# Patient Record
Sex: Male | Born: 2000 | Race: White | Hispanic: No | Marital: Single | Smoking: Never smoker
Health system: Southern US, Community
[De-identification: ages and names within clinical notes are randomized; demographics above are authoritative.]

## PROBLEM LIST (undated history)

## (undated) HISTORY — PX: NO PAST SURGERIES: SHX2092

## (undated) HISTORY — PX: COLONOSCOPY: SHX174

---
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
## 2020-04-19 DIAGNOSIS — G8929 Other chronic pain: Secondary | ICD-10-CM | POA: Insufficient documentation

## 2021-09-18 ENCOUNTER — Encounter: Payer: Self-pay | Admitting: Pediatric Gastroenterology

## 2021-09-19 ENCOUNTER — Encounter: Payer: Self-pay | Admitting: Gastroenterology

## 2022-01-29 ENCOUNTER — Ambulatory Visit (INDEPENDENT_AMBULATORY_CARE_PROVIDER_SITE_OTHER): Payer: Self-pay | Admitting: Gastroenterology

## 2022-01-29 ENCOUNTER — Encounter: Payer: Self-pay | Admitting: Gastroenterology

## 2022-01-29 VITALS — BP 121/63 | HR 60 | Temp 98.0°F | Ht 75.0 in | Wt 178.5 lb

## 2022-01-29 DIAGNOSIS — K5010 Crohn's disease of large intestine without complications: Secondary | ICD-10-CM

## 2022-01-29 MED ORDER — MESALAMINE 1.2 G PO TBEC: 1.2000 g | DELAYED_RELEASE_TABLET | Freq: Two times a day (BID) | ORAL | 3 refills | Status: DC

## 2022-01-29 NOTE — Progress Notes (Signed)
Cephas Darby, MD [address]  [place]  [place], [place] [address]  Main: [phone]  Fax: [phone]    Gastroenterology Consultation  Referring Provider:     Jerrol Banana.,* Primary Care Physician:  Jerrol Banana., MD Primary Gastroenterologist:  Dr. Kathie Dike, [facility] Reason for Consultation: Crohn's colitis        HPI:   Curtis Moran is a 21 y.o. male referred by Dr. Rosanna Randy, Retia Passe., MD  for consultation & management of Crohn's colitis.  Patient is diagnosed with Crohn's colitis in 2016 after he had an upper endoscopy and colonoscopy on 08/19/2014 which showed gastritis, colitis and proctitis.  TI was not examined.  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  MR enterography of the small bowel was unremarkable.  Since then, he is maintained on Lialda.  He denies any extraintestinal manifestations.  He is currently on Lialda 1.2 g twice daily.  He ran out of medication about a month ago.  He is switching from pediatric GI to adult gastroenterology.  He noticed softer stools than normal with some cramps since stopping Lialda.  His weight has been stable.  He does not smoke or drink alcohol  He attends college in [place], plays college baseball  NSAIDs: None  Antiplts/Anticoagulants/Anti thrombotics: None He denies any family history of IBD, GI malignancy  GI Procedures:  EGD and colonoscopy 09/20/2020 Upper Endoscopy Impressions: - Normal esophagus. Biopsied. - Erythematous mucosa in the stomach. Biopsied. - Normal duodenal bulb, second portion of the duodenum and third portion of the duodenum. Biopsied.   Colonoscopy Impressions: - Erythematous mucosa in the rectum and in the sigmoid colon. Biopsied. - The descending colon, transverse colon, ascending colon and cecum are normal. Biopsied. - The examined portion of the ileum was normal. Biopsied  A.  Duodenum, endoscopic  biopsy: Duodenal mucosa with preserved villous architecture. No increase in intraepithelial lymphocytes..     B.  Stomach, endoscopic biopsy: Gastric antral and oxyntic mucosa with chronic gastritis. To assess the potential etiology of the gastritis, immunohistochemistry for H. pylori was performed (block B1) and the result is negative.     C.  Esophagus, endoscopic biopsy: Esophageal squamous mucosa with no specific pathologic diagnosis. No increase in intraepithelial eosinophils is seen.    D.  Ileum, terminal, endoscopic biopsy: Ileal mucosa with a single non-necrotizing granuloma.    E.  Colon, cecum, ascending, endoscopic biopsy: Colonic mucosa with minimal crypt disarray.    F.  Colon, transverse, descending, endoscopic biopsy: Colonic mucosa with no specific pathologic diagnosis.    G.  Colon, sigmoid, rectum, endoscopic biopsy: Colorectal mucosa with focal reactive lymphoid aggregates.  EGD and colonoscopy 2016 A. Duodenum, second part, endoscopic biopsy:  Duodenal mucosa with no pathologic diagnosis. No acute or chronic duodenitis is identified. No granulomas are identified.   B. Stomach, antrum, endoscopic biopsy:  Gastric antral mucosa with moderate chronic focally active gastritis. Immunohistochemistry for Helicobacter pylori is negative. No granulomas are identified.   C. Esophagus, lower third, endoscopic biopsy:  Squamous mucosa with no pathologic diagnosis. No granulomas are identified.   D. Esophagus, upper third, endoscopic biopsy:  Squamous mucosa with no pathologic diagnosis. No granulomas are identified.   E. Ascending colon, right large intestine, endoscopic biopsy:  Colonic mucosa with a few scattered small non-caseating granulomas and erosions. See comment.   F. Transverse colon, large intestine, endoscopic biopsy:  Colonic mucosa with pathologic diagnosis. No chronic or active colitis is  identified. No granulomas are identified.   G.  Descending colon, left large intestine, endoscopic biopsy:  Colonic mucosa with features of focal active colitis and rare small non-caseating granulomas. See comment.   H. Sigmoid colon, large intestine, endoscopic biopsy:  Colonic mucosa with features of focal active colitis. See comment.   I. Rectum, endoscopic biopsy:  Rectal mucosa with focal active proctitis. See comment. No chronic or active colitis is identified. No granulomas are identified.  History reviewed. No pertinent past medical history.  Past Surgical History:  Procedure Laterality Date   NO PAST SURGERIES      Current Outpatient Medications:    cetirizine (ZYRTEC) 10 MG tablet, Take 10 mg by mouth daily., Disp: , Rfl:    mesalamine (LIALDA) 1.2 g EC tablet, Take 1 tablet (1.2 g total) by mouth 2 (two) times daily., Disp: 180 tablet, Rfl: 3   mometasone (NASONEX) 50 MCG/ACT nasal spray, Place 2 sprays into the nose daily., Disp: , Rfl:    Multiple Vitamin (MULTIVITAMIN) tablet, Take 1 tablet by mouth daily., Disp: , Rfl:    Olopatadine HCl (PATADAY) 0.2 % SOLN, once as needed. , Disp: , Rfl:    Family History  Problem Relation Age of Onset   Allergies Mother    Hypertension Mother    Mitral valve prolapse Mother    Stroke Mother 66   Allergies Sister    Mitral valve prolapse Maternal Grandmother    Parkinson's disease Maternal Grandmother    Cancer Maternal Grandfather        lung   Hypertension Maternal Grandfather    Cancer Other        fam history of colon cancer     Social History   Tobacco Use   Smoking status: Never   Smokeless tobacco: Never  Substance Use Topics   Alcohol use: Yes    Comment: social   Drug use: No    Allergies as of 01/29/2022 - Review Complete 01/29/2022  Allergen Reaction Noted   Peanut oil Shortness Of Breath 03/17/2015   Other  03/16/2015    Review of Systems:    All systems reviewed and negative except where noted in HPI.   Physical Exam:  BP 121/63 (BP  Location: Right Arm, Patient Position: Sitting, Cuff Size: Normal)   Pulse 60   Temp 98 F (36.7 C) (Oral)   Ht 6' 3"  (1.905 m)   Wt 178 lb 8 oz (81 kg)   BMI 22.31 kg/m  No LMP for male patient.  General:   Alert,  Well-developed, well-nourished, pleasant and cooperative in NAD Head:  Normocephalic and atraumatic. Eyes:  Sclera clear, no icterus.   Conjunctiva pink. Ears:  Normal auditory acuity. Nose:  No deformity, discharge, or lesions. Mouth:  No deformity or lesions,oropharynx pink & moist. Neck:  Supple; no masses or thyromegaly. Lungs:  Respirations even and unlabored.  Clear throughout to auscultation.   No wheezes, crackles, or rhonchi. No acute distress. Heart:  Regular rate and rhythm; no murmurs, clicks, rubs, or gallops. Abdomen:  Normal bowel sounds. Soft, non-tender and non-distended without masses, hepatosplenomegaly or hernias noted.  No guarding or rebound tenderness.   Rectal: Not performed Msk:  Symmetrical without gross deformities. Good, equal movement & strength bilaterally. Pulses:  Normal pulses noted. Extremities:  No clubbing or edema.  No cyanosis. Neurologic:  Alert and oriented x3;  grossly normal neurologically. Skin:  Intact without significant lesions or rashes. No jaundice. Psych:  Alert and cooperative. Normal mood and  affect.  Imaging Studies: Reviewed  Assessment and Plan:   Curtis Moran is a 21 y.o. pleasant Caucasian male with history of chronic mild active Crohn's colitis diagnosed in 2016 is maintained on Lialda 2.4 g daily No evidence of anemia.  Currently in clinical and histologic remission Continue Lialda 2.4 g daily  Follow up annually, contact via MyChart as needed   Cephas Darby, MD

## 2022-02-20 IMAGING — MR MR SHOULDER*R* W/CM
6 series · 40 of 40 positions shown · IV contrast (agent unspecified)
Comparison: None.
COMPARISON: None.

Addendum:
CLINICAL DATA: Right shoulder pain after playing baseball, pain
began [DATE]

EXAM:
MR ARTHROGRAM OF THE right SHOULDER
TECHNIQUE: Multiplanar, multisequence MR imaging of the right shoulder was
performed following the administration of intra-articular contrast.
CONTRAST:  See Injection Documentation.

[Series 5: T1 fat-sat · axial · right · 4.0mm · 0.55mm/px · z∈[-30,+90]mm · 6 of 25 slices shown (1 of 3)]
[im 1/25]
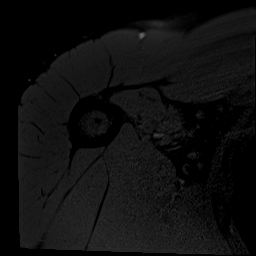
[im 5/25]
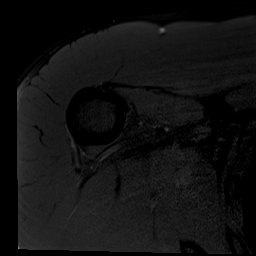
[im 10/25]
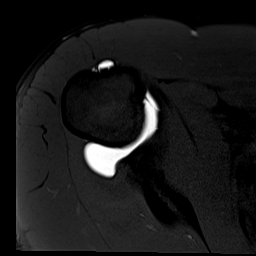
[im 15/25]
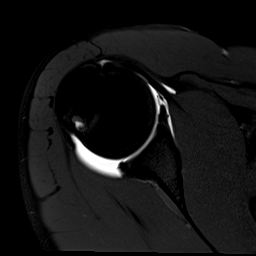
[im 20/25]
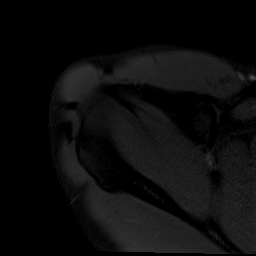
[im 25/25]
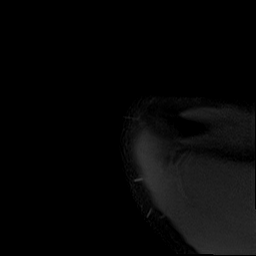

[Series 6: T1 fat-sat · oblique · right · 4.0mm · 0.55mm/px · 6 of 25 slices shown (2 of 3)]
[im 1/25]
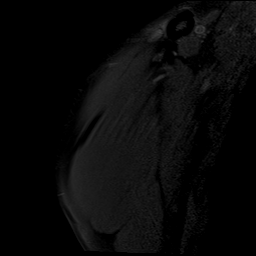
[im 5/25]
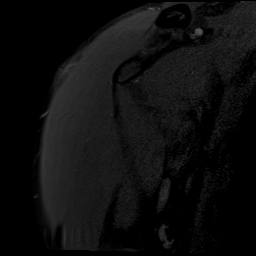
[im 10/25]
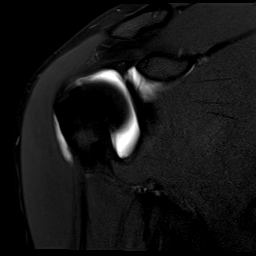
[im 15/25]
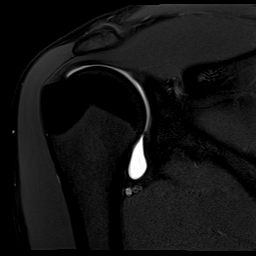
[im 20/25]
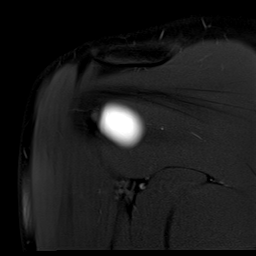
[im 25/25]
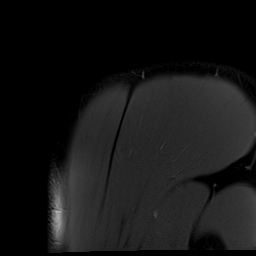

[Series 7: T2 fat-sat · oblique · right · 4.0mm · 0.55mm/px · 7 of 26 slices shown (1 of 2)]
[im 1/26]
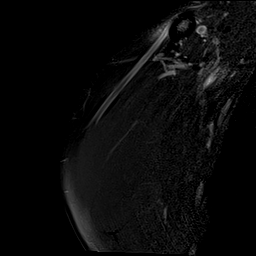
[im 5/26]
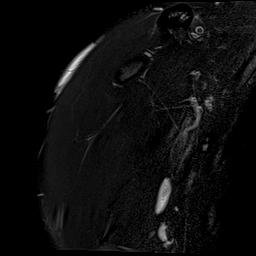
[im 9/26]
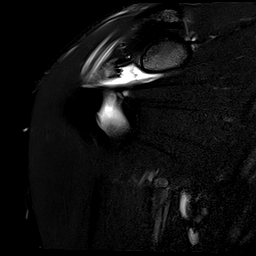
[im 13/26]
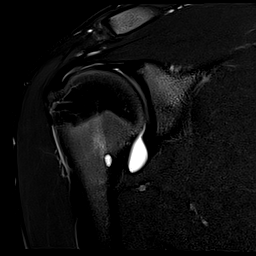
[im 17/26]
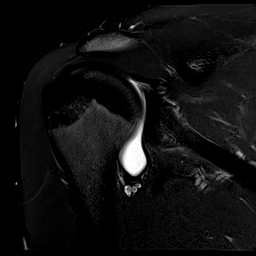
[im 21/26]
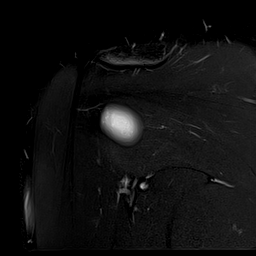
[im 26/26]
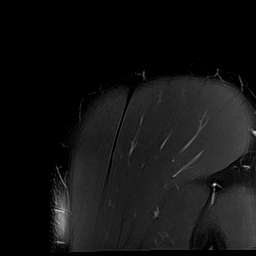

[Series 9: T2 fat-sat · coronal · right · 4.0mm · 0.55mm/px · 7 of 25 slices shown (2 of 2)]
[im 1/25]
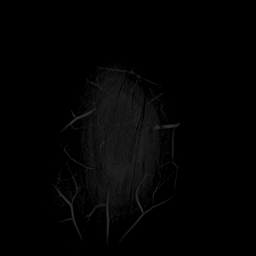
[im 5/25]
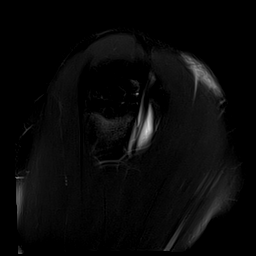
[im 9/25]
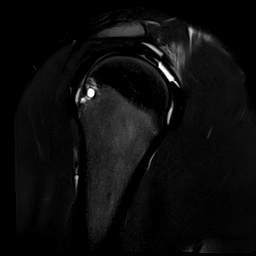
[im 13/25]
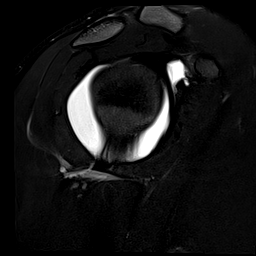
[im 17/25]
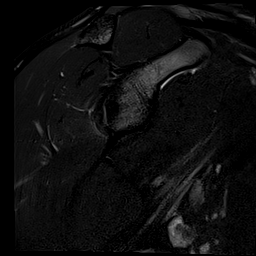
[im 21/25]
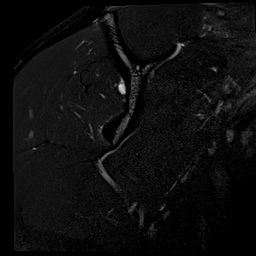
[im 25/25]
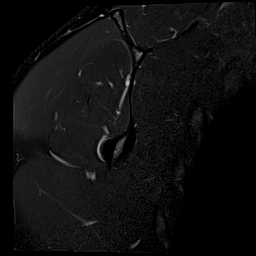

[Series 10: T1 · oblique · right · 4.0mm · 0.51mm/px · 7 of 26 slices shown]
[im 1/26]
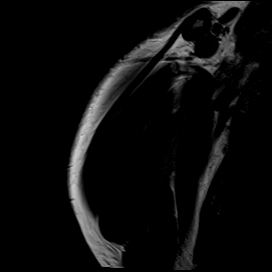
[im 5/26]
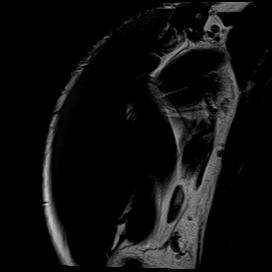
[im 9/26]
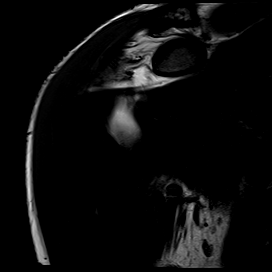
[im 13/26]
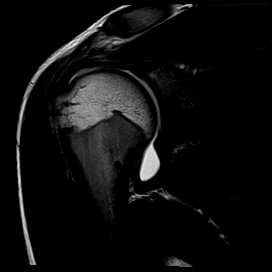
[im 17/26]
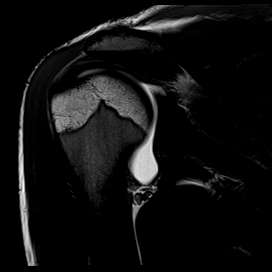
[im 21/26]
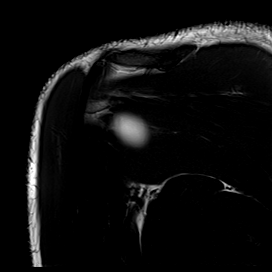
[im 26/26]
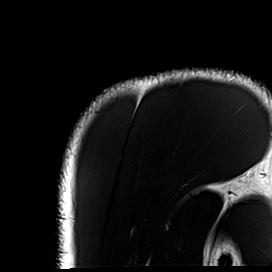

[Series 14: T1 fat-sat · sagittal · right · 4.0mm · 0.62mm/px · 7 of 26 slices shown (3 of 3)]
[im 1/26]
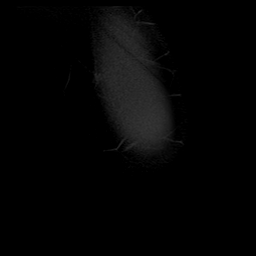
[im 5/26]
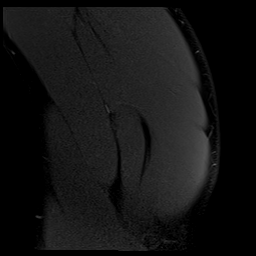
[im 9/26]
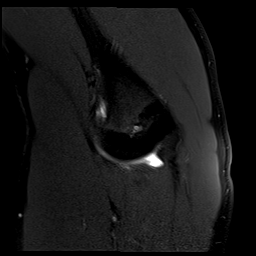
[im 13/26]
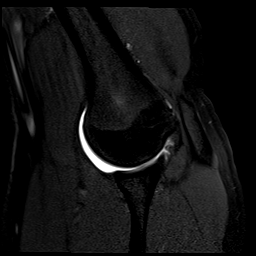
[im 17/26]
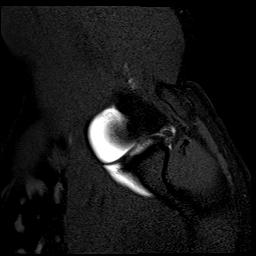
[im 21/26]
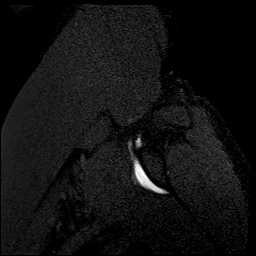
[im 26/26]
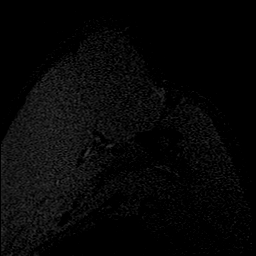

[40 of 40 positions shown; findings below may reference images not displayed]

FINDINGS: Rotator cuff: Mildly increased signal seen at the insertion site of
the infraspinatus tendon at the insertion site. The supraspinatus,
subscapularis, and teres minor tendons are intact. The muscles of
the rotator cuff are normal without tear, edema, or atrophy.

Muscles: The muscles other than the rotator cuff are normal without
tear, edema, or atrophy.

Biceps Long Head: The Intraarticular and extraarticular portions of
the biceps tendon are normal in position, size and signal.

Acromioclavicular Joint: The acromioclavicular joint is intact. Type
II acromion.

Glenohumeral Joint: The glenohumeral joint alignment is well
maintained. The humeral head and glenoid articular cartilage are
without focal defect or significant thinning. No joint effusion.

Labrum: The glenoid labrum is grossly intact without evidence large
tear or detachment. However the evaluation is limited by lack of
intraarticular fluid.

Bones: No fracture, osteonecrosis, or pathologic marrow
infiltration. There is slight contour abnormality of the
posterolateral corner of the humeral head with small cystic changes.

Other: The subacromial-subdeltoid bursa is normal without evidence
of bursitis.
IMPRESSION: Mild insertional infraspinatus tendinosis. No full-thickness rotator
cuff tears.

Slight contour abnormality of the posterolateral corner of the
humeral head with small cystic changes which could be from a prior
injury.

ADDENDUM:
Under the labrum section above it should state the labrum is intact
without evidence of tear or detachment. Adequate distension is seen
with intra-articular contrast.

*** End of Addendum ***
FINDINGS: Rotator cuff: Mildly increased signal seen at the insertion site of
the infraspinatus tendon at the insertion site. The supraspinatus,
subscapularis, and teres minor tendons are intact. The muscles of
the rotator cuff are normal without tear, edema, or atrophy.

Muscles: The muscles other than the rotator cuff are normal without
tear, edema, or atrophy.

Biceps Long Head: The Intraarticular and extraarticular portions of
the biceps tendon are normal in position, size and signal.

Acromioclavicular Joint: The acromioclavicular joint is intact. Type
II acromion.

Glenohumeral Joint: The glenohumeral joint alignment is well
maintained. The humeral head and glenoid articular cartilage are
without focal defect or significant thinning. No joint effusion.

Labrum: The glenoid labrum is grossly intact without evidence large
tear or detachment. However the evaluation is limited by lack of
intraarticular fluid.

Bones: No fracture, osteonecrosis, or pathologic marrow
infiltration. There is slight contour abnormality of the
posterolateral corner of the humeral head with small cystic changes.

Other: The subacromial-subdeltoid bursa is normal without evidence
of bursitis.
IMPRESSION: Mild insertional infraspinatus tendinosis. No full-thickness rotator
cuff tears.

Slight contour abnormality of the posterolateral corner of the
humeral head with small cystic changes which could be from a prior
injury.

## 2022-02-20 IMAGING — RF DG ARTHROGRAM SHOULDER*R*
3 series · 3 of 3 positions shown · non-contrast
Comparison: None.

MEDICATIONS:
None.

FLUOROSCOPY TIME:  48 seconds

COMPLICATIONS:
None immediate

INDICATION: Right shoulder pain

EXAM:
ARTHROGRAM OF THE RIGHT SHOULDER
TECHNIQUE: Informed written consent was obtained from the patient after a
discussion of the risks, benefits and alternatives to treatment. The
patient was placed supine on the fluoroscopy table and the right
upper extremity was placed in a slight degree of external rotation.
The anterior aspect of the shoulder was prepped and draped in the
usual sterile fashion. A timeout was performed prior to the
initiation of the procedure.

[Series 1: fluoro_arthrogram_singleshot_bw · 0.17mm/px · 1 of 1 slices shown (1 of 3)]
[im 1/1]
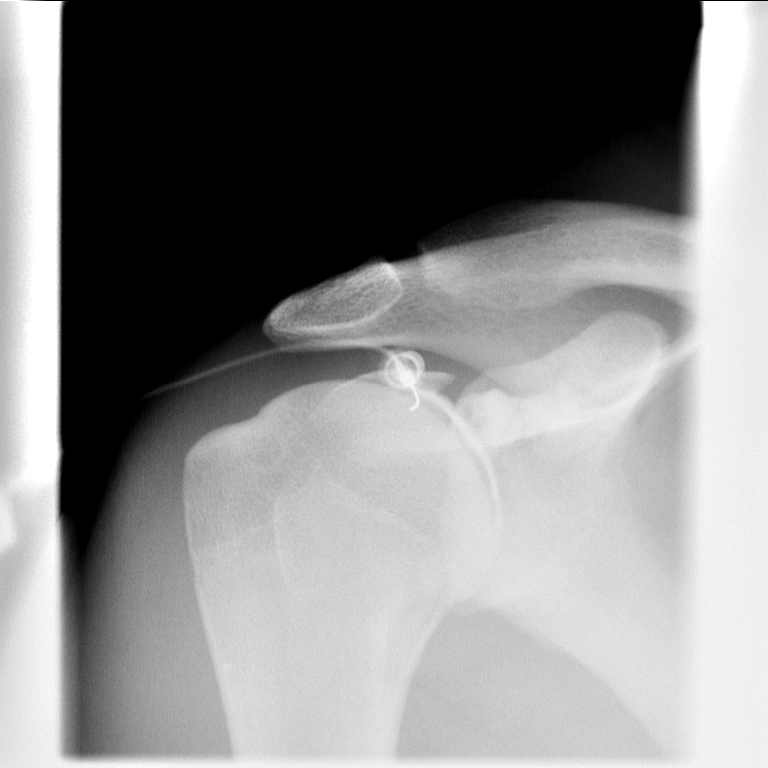

[Series 2: fluoro_arthrogram_singleshot_bw · 0.17mm/px · 1 of 1 slices shown (2 of 3)]
[im 1/1]
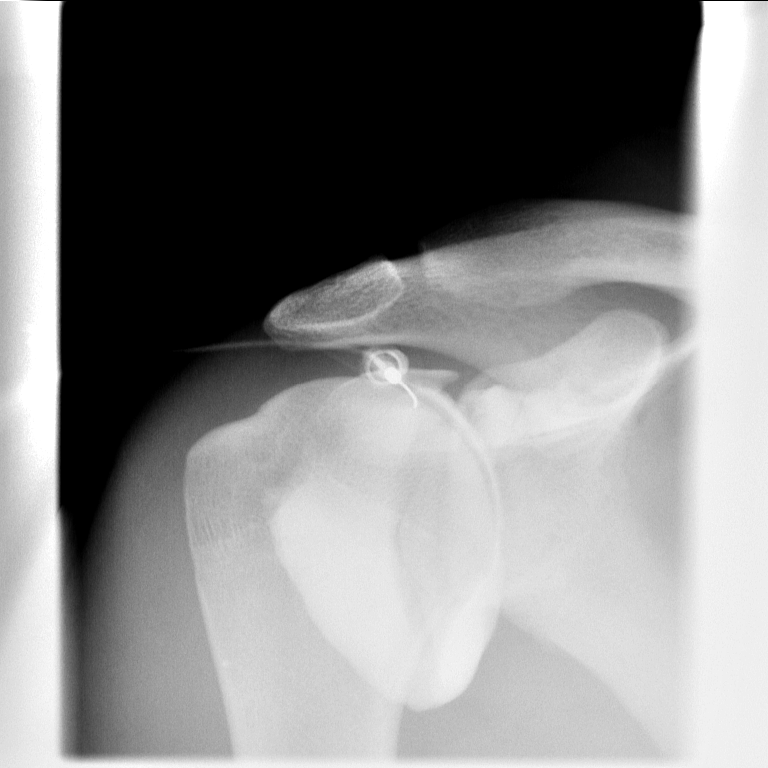

[Series 3: fluoro_arthrogram_singleshot_bw · 0.17mm/px · 1 of 1 slices shown (3 of 3)]
[im 1/1]
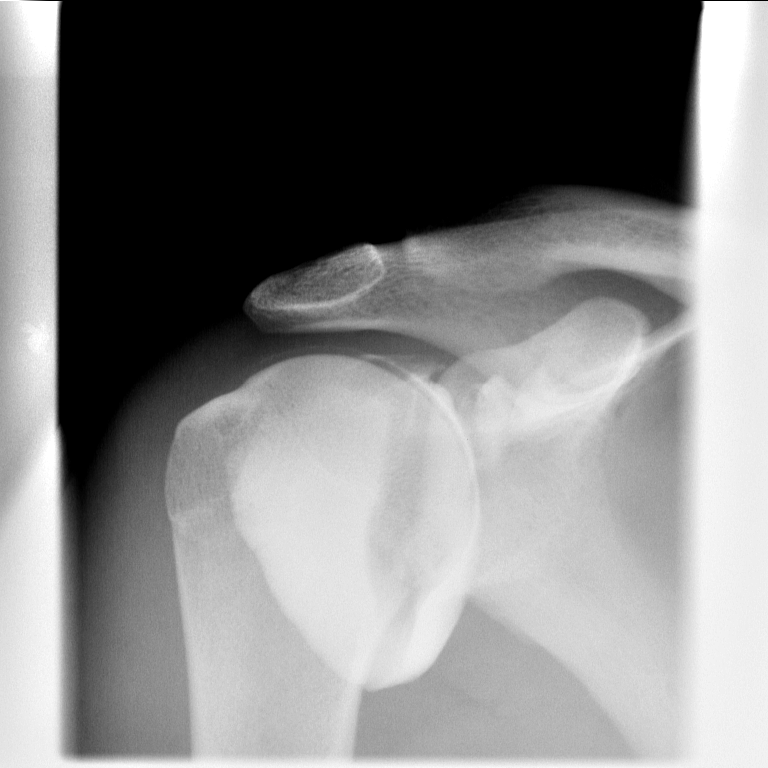

[3 of 3 positions shown; findings below may reference images not displayed]

The inferior medial aspect of the humeral head was marked
fluoroscopically and the overlying soft tissues were anesthetized
with 1% lidocaine. Under intermittent fluoroscopic guidance, a 22
gauge spinal needle was advanced into the glenohumeral joint space.
Appropriate positioning was confirmed with the injection of a small
amount of contrast. At this point a total of 12 mL of a mixture of
10 mL of Omnipaque 180, 10 mL normal saline and 0.05 mL of Gadavist
was injected into the joint space. A fluoroscopic image was saved
and sent to PACS. At this point the procedure was terminated. The
needle was withdrawn and a dressing was placed. The patient
tolerated the procedure well without immediate post procedural
complication. The patient was escorted to MRI.
IMPRESSION: Successful fluoroscopic guided arthrogram of the right shoulder.

## 2023-05-06 ENCOUNTER — Other Ambulatory Visit: Payer: Self-pay | Admitting: Gastroenterology

## 2023-05-26 ENCOUNTER — Telehealth: Payer: Self-pay | Admitting: Gastroenterology

## 2023-05-26 MED ORDER — MESALAMINE 1.2 G PO TBEC: 1.2000 g | DELAYED_RELEASE_TABLET | Freq: Two times a day (BID) | ORAL | 0 refills | Status: DC

## 2023-05-26 NOTE — Telephone Encounter (Signed)
 Last office visit 01/29/2022 crohn's Last refill  01/29/2022 3 refills

## 2023-05-26 NOTE — Addendum Note (Signed)
 Addended by: Conny Del L on: 05/26/2023 03:08 PM   Modules accepted: Orders

## 2023-05-26 NOTE — Telephone Encounter (Signed)
 The patient called requesting to schedule an appointment with Dr. Baldomero Bone for a medication refill. The medication is Lialda 1.2g EC.  The patient's preferred pharmacy is: [facility] [address] [place], [place] [address] Phone: [phone]

## 2023-06-18 ENCOUNTER — Ambulatory Visit: Payer: Self-pay | Admitting: Gastroenterology

## 2023-06-18 ENCOUNTER — Encounter: Payer: Self-pay | Admitting: Gastroenterology

## 2023-06-18 VITALS — BP 119/71 | HR 75 | Temp 97.8°F | Ht 75.0 in | Wt 214.2 lb

## 2023-06-18 DIAGNOSIS — K5010 Crohn's disease of large intestine without complications: Secondary | ICD-10-CM | POA: Diagnosis not present

## 2023-06-18 DIAGNOSIS — Z91010 Allergy to peanuts: Secondary | ICD-10-CM | POA: Insufficient documentation

## 2023-06-18 MED ORDER — MESALAMINE 1.2 G PO TBEC: 1.2000 g | DELAYED_RELEASE_TABLET | Freq: Two times a day (BID) | ORAL | 3 refills | Status: DC

## 2023-06-18 NOTE — Progress Notes (Signed)
 Karma Oz, MD [address]  [address]  [place], [place] [address]  Main: [phone]  Fax: [phone]    Gastroenterology Consultation  Referring Provider:     Nikki Barters, MD Primary Care Physician:  Nikki Barters, MD Primary Gastroenterologist:  Dr. Charlet Conradi, [facility] Reason for Consultation: Crohn's colitis        HPI:   Curtis Moran is a 23 y.o. male referred by Dr. Nikki Barters, MD  for consultation & management of Crohn's colitis.  Patient is diagnosed with Crohn's colitis in 2016 after he had an upper endoscopy and colonoscopy on 08/19/2014 which showed gastritis, colitis and proctitis.  TI was not examined.  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  MR enterography of the small bowel was unremarkable.  Since then, he is maintained on Lialda .  He denies any extraintestinal manifestations.  He is currently on Lialda  1.2 g twice daily.  He ran out of medication about a month ago.  He is switching from pediatric GI to adult gastroenterology.  He noticed softer stools than normal with some cramps since stopping Lialda .  His weight has been stable.  Follow-up visit 06/18/2023 Curtis Moran is here for refills on Lialda .  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  He denies any symptoms related to Crohn's disease.  He has gained about 15 pounds within last 1 year or so.  He tries to stay active.  He does not smoke or drink alcohol  NSAIDs: None  Antiplts/Anticoagulants/Anti thrombotics: None He denies any family history of IBD, GI malignancy  GI Procedures:  EGD and colonoscopy 09/20/2020 Upper Endoscopy Impressions: - Normal esophagus. Biopsied. - Erythematous mucosa in the stomach. Biopsied. - Normal duodenal bulb, second portion of the duodenum and third portion of the duodenum. Biopsied.   Colonoscopy  Impressions: - Erythematous mucosa in the rectum and in the  sigmoid colon. Biopsied. - The descending colon, transverse colon, ascending colon and cecum are normal. Biopsied. - The examined portion of the ileum was normal. Biopsied  A.  Duodenum, endoscopic biopsy: Duodenal mucosa with preserved villous architecture. No increase in intraepithelial lymphocytes..     B.  Stomach, endoscopic biopsy: Gastric antral and oxyntic mucosa with chronic gastritis. To assess the potential etiology of the gastritis, immunohistochemistry for H. pylori was performed (block B1) and the result is negative.     C.  Esophagus, endoscopic biopsy: Esophageal squamous mucosa with no specific pathologic diagnosis. No increase in intraepithelial eosinophils is seen.    D.  Ileum, terminal, endoscopic biopsy: Ileal mucosa with a single non-necrotizing granuloma.    E.  Colon, cecum, ascending, endoscopic biopsy: Colonic mucosa with minimal crypt disarray.    F.  Colon, transverse, descending, endoscopic biopsy: Colonic mucosa with no specific pathologic diagnosis.    G.  Colon, sigmoid, rectum, endoscopic biopsy: Colorectal mucosa with focal reactive lymphoid aggregates.  EGD and colonoscopy 2016 A. Duodenum, second part, endoscopic biopsy:  Duodenal mucosa with no pathologic diagnosis. No acute or chronic duodenitis is identified. No granulomas are identified.   B. Stomach, antrum, endoscopic biopsy:  Gastric antral mucosa with moderate chronic focally active gastritis. Immunohistochemistry for Helicobacter pylori is negative. No granulomas are identified.   C. Esophagus, lower third, endoscopic biopsy:  Squamous mucosa with no pathologic diagnosis. No granulomas are identified.   D. Esophagus, upper third, endoscopic biopsy:  Squamous mucosa with no pathologic diagnosis. No granulomas are identified.   E.  Ascending colon, right large intestine, endoscopic biopsy:  Colonic mucosa with a few scattered small non-caseating granulomas and erosions. See  comment.   F. Transverse colon, large intestine, endoscopic biopsy:  Colonic mucosa with pathologic diagnosis. No chronic or active colitis is identified. No granulomas are identified.   G. Descending colon, left large intestine, endoscopic biopsy:  Colonic mucosa with features of focal active colitis and rare small non-caseating granulomas. See comment.   H. Sigmoid colon, large intestine, endoscopic biopsy:  Colonic mucosa with features of focal active colitis. See comment.   I. Rectum, endoscopic biopsy:  Rectal mucosa with focal active proctitis. See comment. No chronic or active colitis is identified. No granulomas are identified.  History reviewed. No pertinent past medical history.  Past Surgical History:  Procedure Laterality Date   NO PAST SURGERIES      Current Outpatient Medications:    cetirizine (ZYRTEC) 10 MG tablet, Take 10 mg by mouth daily., Disp: , Rfl:    mometasone (NASONEX) 50 MCG/ACT nasal spray, Place 2 sprays into the nose daily., Disp: , Rfl:    Multiple Vitamin (MULTIVITAMIN) tablet, Take 1 tablet by mouth daily., Disp: , Rfl:    Olopatadine HCl (PATADAY) 0.2 % SOLN, once as needed. , Disp: , Rfl:    mesalamine  (LIALDA ) 1.2 g EC tablet, Take 1 tablet (1.2 g total) by mouth 2 (two) times daily., Disp: 180 tablet, Rfl: 3   Family History  Problem Relation Age of Onset   Allergies Mother    Hypertension Mother    Mitral valve prolapse Mother    Stroke Mother 7   Allergies Sister    Mitral valve prolapse Maternal Grandmother    Parkinson's disease Maternal Grandmother    Cancer Maternal Grandfather        lung   Hypertension Maternal Grandfather    Cancer Other        fam history of colon cancer     Social History   Tobacco Use   Smoking status: Never   Smokeless tobacco: Never  Substance Use Topics   Alcohol use: Yes    Comment: social   Drug use: No    Allergies as of 06/18/2023 - Review Complete 06/18/2023  Allergen Reaction Noted    Peanut oil Shortness Of Breath 03/17/2015   Other  03/16/2015    Review of Systems:    All systems reviewed and negative except where noted in HPI.   Physical Exam:  BP 119/71 (BP Location: Left Arm, Patient Position: Sitting, Cuff Size: Normal)   Pulse 75   Temp 97.8 F (36.6 C) (Oral)   Ht 6\' 3"  (1.905 m)   Wt 214 lb 4 oz (97.2 kg)   BMI 26.78 kg/m  No LMP for male patient.  General:   Alert,  Well-developed, well-nourished, pleasant and cooperative in NAD Head:  Normocephalic and atraumatic. Eyes:  Sclera clear, no icterus.   Conjunctiva pink. Ears:  Normal auditory acuity. Nose:  No deformity, discharge, or lesions. Mouth:  No deformity or lesions,oropharynx pink & moist. Neck:  Supple; no masses or thyromegaly. Lungs:  Respirations even and unlabored.  Clear throughout to auscultation.   No wheezes, crackles, or rhonchi. No acute distress. Heart:  Regular rate and rhythm; no murmurs, clicks, rubs, or gallops. Abdomen:  Normal bowel sounds. Soft, non-tender and non-distended without masses, hepatosplenomegaly or hernias noted.  No guarding or rebound tenderness.   Rectal: Not performed Msk:  Symmetrical without gross deformities. Good, equal movement & strength bilaterally.  Pulses:  Normal pulses noted. Extremities:  No clubbing or edema.  No cyanosis. Neurologic:  Alert and oriented x3;  grossly normal neurologically. Skin:  Intact without significant lesions or rashes. No jaundice. Psych:  Alert and cooperative. Normal mood and affect.  Imaging Studies: Reviewed  Assessment and Plan:   Angie Favret is a 23 y.o. pleasant Caucasian male with history of chronic mild active Crohn's colitis diagnosed in 2016 is maintained on Lialda  2.4 g daily No evidence of anemia.  Currently in clinical and histologic remission Continue Lialda  2.4 g daily, refill medication  Follow up annually, contact via MyChart as needed   Karma Oz, MD

## 2023-08-18 ENCOUNTER — Encounter: Payer: Self-pay | Admitting: Gastroenterology

## 2023-09-10 ENCOUNTER — Other Ambulatory Visit: Payer: Self-pay

## 2023-09-10 ENCOUNTER — Ambulatory Visit: Admitting: Certified Registered"

## 2023-09-10 ENCOUNTER — Ambulatory Visit
Admission: RE | Admit: 2023-09-10 | Discharge: 2023-09-10 | Disposition: A | Discharge status: Home / Self Care | Attending: Gastroenterology | Admitting: Gastroenterology

## 2023-09-10 ENCOUNTER — Encounter: Payer: Self-pay | Admitting: Gastroenterology

## 2023-09-10 ENCOUNTER — Encounter
Admission: RE | Disposition: A | Discharge status: Home / Self Care | Payer: Self-pay | Source: Home / Self Care | Attending: Gastroenterology

## 2023-09-10 DIAGNOSIS — K5080 Crohn's disease of both small and large intestine without complications: Secondary | ICD-10-CM | POA: Diagnosis present

## 2023-09-10 DIAGNOSIS — D123 Benign neoplasm of transverse colon: Secondary | ICD-10-CM | POA: Diagnosis not present

## 2023-09-10 DIAGNOSIS — K644 Residual hemorrhoidal skin tags: Secondary | ICD-10-CM | POA: Diagnosis not present

## 2023-09-10 HISTORY — PX: POLYPECTOMY: SHX149

## 2023-09-10 HISTORY — PX: COLONOSCOPY: SHX5424

## 2023-09-10 SURGERY — COLONOSCOPY
Anesthesia: General

## 2023-09-10 MED ORDER — GLYCOPYRROLATE 0.2 MG/ML IJ SOLN
INTRAMUSCULAR | Status: DC | PRN
  Administered 2023-09-10: .2 mg via INTRAVENOUS

## 2023-09-10 MED ORDER — SODIUM CHLORIDE 0.9 % IV SOLN
INTRAVENOUS | Status: DC
  Administered 2023-09-10: 500 mL via INTRAVENOUS

## 2023-09-10 MED ORDER — BUDESONIDE 3 MG PO CPEP
9.0000 mg | ORAL_CAPSULE | Freq: Every day | ORAL | 1 refills | Status: AC
Start: 2023-09-10 — End: 2023-11-09

## 2023-09-10 MED ORDER — MIDAZOLAM HCL 2 MG/2ML IJ SOLN
INTRAMUSCULAR | Status: DC | PRN
  Administered 2023-09-10: 2 mg via INTRAVENOUS

## 2023-09-10 MED ORDER — PROPOFOL 500 MG/50ML IV EMUL
INTRAVENOUS | Status: DC | PRN
  Administered 2023-09-10: 165 ug/kg/min via INTRAVENOUS

## 2023-09-10 MED ORDER — MIDAZOLAM HCL 2 MG/2ML IJ SOLN
INTRAMUSCULAR | Status: AC
  Filled 2023-09-10: qty 2

## 2023-09-10 MED ORDER — LIDOCAINE HCL (CARDIAC) PF 100 MG/5ML IV SOSY
PREFILLED_SYRINGE | INTRAVENOUS | Status: DC | PRN
  Administered 2023-09-10: 100 mg via INTRAVENOUS

## 2023-09-10 MED ORDER — DEXMEDETOMIDINE HCL IN NACL 200 MCG/50ML IV SOLN
INTRAVENOUS | Status: DC | PRN
  Administered 2023-09-10: 12 ug via INTRAVENOUS

## 2023-09-10 MED ORDER — PROPOFOL 10 MG/ML IV BOLUS
INTRAVENOUS | Status: DC | PRN
  Administered 2023-09-10: 40 mg via INTRAVENOUS
  Administered 2023-09-10: 60 mg via INTRAVENOUS
  Administered 2023-09-10: 40 mg via INTRAVENOUS

## 2023-09-10 NOTE — H&P (Signed)
 Curtis JONELLE Brooklyn, MD [facility], [facility] [address]  [place], [place] [address]  Main: [phone] Fax:  [phone] Pager: [phone]   Primary Care Physician:  Bertrum Charlie CROME, MD Primary Gastroenterologist:  Dr. Corinn JONELLE Moran  Pre-Procedure History & Physical: HPI:  Curtis Moran is a 23 y.o. male is here for an colonoscopy.   History reviewed. No pertinent past medical history.  Past Surgical History:  Procedure Laterality Date   COLONOSCOPY     NO PAST SURGERIES      Prior to Admission medications   Medication Sig Start Date End Date Taking? Authorizing Provider  cetirizine (ZYRTEC) 10 MG tablet Take 10 mg by mouth daily.    [provider]  mesalamine  (LIALDA ) 1.2 g EC tablet Take 1 tablet (1.2 g total) by mouth 2 (two) times daily. 06/18/23   Shantal Roan Reddy, MD  mometasone (NASONEX) 50 MCG/ACT nasal spray Place 2 sprays into the nose daily.    [provider]  Multiple Vitamin (MULTIVITAMIN) tablet Take 1 tablet by mouth daily.    [provider]  Olopatadine HCl (PATADAY) 0.2 % SOLN once as needed.     [provider]    Allergies as of 08/29/2023 - Review Complete 06/18/2023  Allergen Reaction Noted   Peanut oil Shortness Of Breath 03/17/2015   Other  03/16/2015    Family History  Problem Relation Age of Onset   Allergies Mother    Hypertension Mother    Mitral valve prolapse Mother    Stroke Mother 6   Allergies Sister    Mitral valve prolapse Maternal Grandmother    Parkinson's disease Maternal Grandmother    Cancer Maternal Grandfather        lung   Hypertension Maternal Grandfather    Cancer Other        fam history of colon cancer    Social History   Socioeconomic History   Marital status: Single    Spouse name: Not on file   Number of children: Not on file   Years of education: Not on file   Highest education level: Not on file  Occupational History   Not on file   Tobacco Use   Smoking status: Never   Smokeless tobacco: Never  Vaping Use   Vaping status: Never Used  Substance and Sexual Activity   Alcohol use: Yes    Comment: every other day - beer   Drug use: No   Sexual activity: Never  Other Topics Concern   Not on file  Social History Narrative   Not on file   Social Drivers of Health   Financial Resource Strain: Low Risk  (08/19/2023)   Received from [facility]   Overall Financial Resource Strain (CARDIA)    Difficulty of Paying Living Expenses: Not hard at all  Food Insecurity: No Food Insecurity (08/19/2023)   Received from [facility]   Hunger Vital Sign    Within the past 12 months, you worried that your food would run out before you got the money to buy more.: Never true    Within the past 12 months, the food you bought just didn't last and you didn't have money to get more.: Never true  Transportation Needs: No Transportation Needs (08/19/2023)   Received from [facility] - Transportation    In the past 12 months, has lack of transportation kept you from medical appointments or from getting  medications?: No    Lack of Transportation (Non-Medical): No  Physical Activity: Not on file  Stress: Not on file  Social Connections: Not on file  Intimate Partner Violence: Not on file    Review of Systems: See HPI, otherwise negative ROS  Physical Exam: BP (!) 120/59   Pulse (!) 51   Temp (!) 96 F (35.6 C) (Tympanic)   Resp 12   Ht 6' 3 (1.905 m)   Wt 94.6 kg   SpO2 99%   BMI 26.07 kg/m  General:   Alert,  pleasant and cooperative in NAD Head:  Normocephalic and atraumatic. Neck:  Supple; no masses or thyromegaly. Lungs:  Clear throughout to auscultation.    Heart:  Regular rate and rhythm. Abdomen:  Soft, nontender and nondistended. Normal bowel sounds, without guarding, and without rebound.   Neurologic:  Alert and  oriented x4;  grossly normal  neurologically.  Impression/Plan: Curtis Moran is here for an colonoscopy to be performed for crohn's colitis  Risks, benefits, limitations, and alternatives regarding  colonoscopy have been reviewed with the patient.  Questions have been answered.  All parties agreeable.   Curtis Brooklyn, MD  09/10/2023, 9:58 AM

## 2023-09-10 NOTE — Anesthesia Preprocedure Evaluation (Signed)
 Anesthesia Evaluation  Patient identified by MRN, date of birth, ID band Patient awake    Reviewed: Allergy & Precautions, NPO status , Patient's Chart, lab work & pertinent test results  Airway Mallampati: III  TM Distance: >3 FB Neck ROM: full    Dental  (+) Chipped   Pulmonary neg pulmonary ROS   Pulmonary exam normal        Cardiovascular negative cardio ROS Normal cardiovascular exam     Neuro/Psych negative neurological ROS     GI/Hepatic negative GI ROS, Neg liver ROS,,,  Endo/Other  negative endocrine ROS    Renal/GU negative Renal ROS  negative genitourinary   Musculoskeletal   Abdominal   Peds  Hematology negative hematology ROS (+)   Anesthesia Other Findings History reviewed. No pertinent past medical history.  Past Surgical History: No date: COLONOSCOPY No date: NO PAST SURGERIES  BMI    Body Mass Index: 26.07 kg/m      Reproductive/Obstetrics negative OB ROS                              Anesthesia Physical Anesthesia Plan  ASA: 1  Anesthesia Plan: General   Post-op Pain Management: Minimal or no pain anticipated   Induction: Intravenous  PONV Risk Score and Plan: 2 and Propofol  infusion and TIVA  Airway Management Planned: Nasal Cannula  Additional Equipment: None  Intra-op Plan:   Post-operative Plan:   Informed Consent: I have reviewed the patients History and Physical, chart, labs and discussed the procedure including the risks, benefits and alternatives for the proposed anesthesia with the patient or authorized representative who has indicated his/her understanding and acceptance.     Dental advisory given  Plan Discussed with: CRNA and Surgeon  Anesthesia Plan Comments: (Discussed risks of anesthesia with patient, including possibility of difficulty with spontaneous ventilation under anesthesia necessitating airway intervention, PONV, and  rare risks such as cardiac or respiratory or neurological events, and allergic reactions. Discussed the role of CRNA in patient's perioperative care. Patient understands.)        Anesthesia Quick Evaluation

## 2023-09-10 NOTE — Anesthesia Procedure Notes (Addendum)
 Procedure Name: General with mask airway Date/Time: 09/10/2023 10:27 AM  Performed by: Ledora Duncan, CRNAPre-anesthesia Checklist: Patient identified, Emergency Drugs available, Suction available, Patient being monitored and Timeout performed Patient Re-evaluated:Patient Re-evaluated prior to induction Oxygen Delivery Method: Simple face mask Induction Type: IV induction Placement Confirmation: positive ETCO2 and breath sounds checked- equal and bilateral Dental Injury: Teeth and Oropharynx as per pre-operative assessment

## 2023-09-10 NOTE — Op Note (Signed)
 [facility] Patient Name: Curtis Moran Procedure Date: 09/10/2023 9:58 AM MRN: [id number] Account #: [id number] Date of Birth: [birth year] Admit Type: Outpatient Age: 23 Room: [facility] ENDO ROOM 4 Gender: Male Note Status: Finalized Instrument Name: Peds Colonoscope [id number] Procedure:             Colonoscopy Indications:           Disease activity assessment of Crohn's disease of the                         colon Providers:             Corinn Jess Brooklyn MD, MD Referring MD:          Charlie CROME. Bertrum, MD (Referring MD) Medicines:             General Anesthesia Complications:         No immediate complications. Estimated blood loss: None. Procedure:             Pre-Anesthesia Assessment:                        - Prior to the procedure, a History and Physical was                         performed, and patient medications and allergies were                         reviewed. The patient is competent. The risks and                         benefits of the procedure and the sedation options and                         risks were discussed with the patient. All questions                         were answered and informed consent was obtained.                         Patient identification and proposed procedure were                         verified by the physician, the nurse, the                         anesthesiologist, the anesthetist and the technician                         in the pre-procedure area in the procedure room in the                         endoscopy suite. Mental Status Examination: alert and                         oriented. Airway Examination: normal oropharyngeal                         airway and neck mobility. Respiratory Examination:  clear to auscultation. CV Examination: normal.                         Prophylactic Antibiotics: The patient does not require                         prophylactic antibiotics.  Prior Anticoagulants: The                         patient has taken no anticoagulant or antiplatelet                         agents. ASA Grade Assessment: I - A normal, healthy                         patient. After reviewing the risks and benefits, the                         patient was deemed in satisfactory condition to                         undergo the procedure. The anesthesia plan was to use                         general anesthesia. Immediately prior to                         administration of medications, the patient was                         re-assessed for adequacy to receive sedatives. The                         heart rate, respiratory rate, oxygen saturations,                         blood pressure, adequacy of pulmonary ventilation, and                         response to care were monitored throughout the                         procedure. The physical status of the patient was                         re-assessed after the procedure.                        After obtaining informed consent, the colonoscope was                         passed under direct vision. Throughout the procedure,                         the patient's blood pressure, pulse, and oxygen                         saturations were monitored continuously. The  Colonoscope was introduced through the anus and                         advanced to the the terminal ileum, with                         identification of the appendiceal orifice and IC                         valve. The colonoscopy was performed without                         difficulty. The patient tolerated the procedure well.                         The quality of the bowel preparation was evaluated                         using the BBPS [facility] Bowel Preparation Scale) with                         scores of: Right Colon = 3, Transverse Colon = 3 and                         Left Colon = 3 (entire mucosa seen well with no                          residual staining, small fragments of stool or opaque                         liquid). The total BBPS score equals 9. The terminal                         ileum, ileocecal valve, appendiceal orifice, and                         rectum were photographed. Findings:      The perianal and digital rectal examinations were normal. Pertinent       negatives include normal sphincter tone and no palpable rectal lesions.      Patchy inflammation characterized by congestion (edema), erosions,       erythema, friability and mucus was found in the terminal ileum. The       inflammation was moderate in severity. Biopsies were taken with a cold       forceps for histology.      Normal mucosa was found in the left colon and in the right colon.       Biopsies were taken with a cold forceps for histology.      A patchy area of mildly erythematous mucosa was found in the distal       rectum. Biopsies were taken with a cold forceps for histology.      Non-bleeding external hemorrhoids were found during retroflexion. The       hemorrhoids were medium-sized.      Two sessile polyps were found in the transverse colon. The polyps were 4       to 5 mm in size. These polyps were removed with a cold snare. Resection  and retrieval were complete. Estimated blood loss: none. Impression:            - Crohn's disease with ileitis. Inflammation was                         found. This was moderate in severity. Biopsied.                        - Normal mucosa in the left colon and in the right                         colon. Biopsied.                        - Erythematous mucosa in the distal rectum. Biopsied.                        - Non-bleeding external hemorrhoids.                        - Two 4 to 5 mm polyps in the transverse colon,                         removed with a cold snare. Resected and retrieved. Recommendation:        - Discharge patient to home (with parent).                         - Resume previous diet today.                        - Continue present medications.                        - Await pathology results.                        - Return to my office as previously scheduled. Procedure Code(s):     --- Professional ---                        [phone], Colonoscopy, flexible; with removal of                         tumor(s), polyp(s), or other lesion(s) by snare                         technique                        45380, 59, Colonoscopy, flexible; with biopsy, single                         or multiple Diagnosis Code(s):     --- Professional ---                        K50.00, Crohn's disease of small intestine without                         complications  K64.4, Residual hemorrhoidal skin tags                        K62.89, Other specified diseases of anus and rectum                        D12.3, Benign neoplasm of transverse colon (hepatic                         flexure or splenic flexure)                        K50.10, Crohn's disease of large intestine without                         complications CPT copyright 2022 American Medical Association. All rights reserved. The codes documented in this report are preliminary and upon coder review may  be revised to meet current compliance requirements. Dr. Corinn Brooklyn Corinn Jess Brooklyn MD, MD 09/10/2023 11:05:56 AM This report has been signed electronically. Number of Addenda: 0 Note Initiated On: 09/10/2023 9:58 AM Scope Withdrawal Time: 0 hours 21 minutes 25 seconds  Total Procedure Duration: 0 hours 27 minutes 32 seconds  Estimated Blood Loss:  Estimated blood loss: none.      [facility]

## 2023-09-10 NOTE — Anesthesia Postprocedure Evaluation (Signed)
 Anesthesia Post Note  Patient: Curtis Moran  Procedure(s) Performed: COLONOSCOPY POLYPECTOMY, INTESTINE  Patient location during evaluation: Endoscopy Anesthesia Type: General Level of consciousness: awake and alert Pain management: pain level controlled Vital Signs Assessment: post-procedure vital signs reviewed and stable Respiratory status: spontaneous breathing, nonlabored ventilation, respiratory function stable and patient connected to nasal cannula oxygen Cardiovascular status: blood pressure returned to baseline and stable Postop Assessment: no apparent nausea or vomiting Anesthetic complications: no   No notable events documented.   Last Vitals:  Vitals:   09/10/23 0947 09/10/23 1105  BP: (!) 120/59 (!) 95/55  Pulse: (!) 51 69  Resp: 12 13  Temp: (!) 35.6 C (!) 35.7 C  SpO2: 99% 98%    Last Pain:  Vitals:   09/10/23 1105  TempSrc: Temporal  PainSc: Asleep                 Debby Mines

## 2023-09-10 NOTE — Transfer of Care (Signed)
 Immediate Anesthesia Transfer of Care Note  Patient: Curtis Moran  Procedure(s) Performed: COLONOSCOPY POLYPECTOMY, INTESTINE  Patient Location:   Anesthesia Type:General Endoscopy Unit Level of Consciousness: drowsy and patient cooperative  Airway & Oxygen Therapy: Patient Spontanous Breathing and Patient connected to face mask oxygen  Post-op Assessment: Report given to RN and Post -op Vital signs reviewed and stable  Post vital signs: Reviewed and stable  Last Vitals:  Vitals Value Taken Time  BP 95/55 09/10/23 11:05  Temp 35.7 C 09/10/23 11:05  Pulse 67 09/10/23 11:06  Resp 13 09/10/23 11:06  SpO2 99 % 09/10/23 11:06  Vitals shown include unfiled device data.  Last Pain:  Vitals:   09/10/23 1105  TempSrc: Temporal  PainSc: Asleep      Patients Stated Pain Goal: 7 (09/10/23 0947)  Complications: No notable events documented.

## 2023-09-10 NOTE — OR Nursing (Signed)
 Notified dr leavy regarding patient's BP remaining low and patient not responding. New orders rec'd to give a 500cc bolus NS

## 2023-09-12 LAB — SURGICAL PATHOLOGY

## 2023-09-16 ENCOUNTER — Ambulatory Visit: Payer: Self-pay | Admitting: Gastroenterology

## 2023-09-16 NOTE — Progress Notes (Signed)
 Please schedule office visit with me to discuss pathology results, virtual visit is ok too  RV
